# Patient Record
Sex: Female | Born: 1970 | Race: Black or African American | Hispanic: No | Marital: Single | Smoking: Never smoker
Health system: Southern US, Community
[De-identification: ages and names within clinical notes are randomized; demographics above are authoritative.]

---

## 2015-03-01 ENCOUNTER — Emergency Department (HOSPITAL_COMMUNITY)
Admission: EM | Admit: 2015-03-01 | Discharge: 2015-03-01 | Disposition: A | Discharge status: Home / Self Care | Payer: BLUE CROSS/BLUE SHIELD | Attending: Emergency Medicine | Admitting: Emergency Medicine

## 2015-03-01 ENCOUNTER — Emergency Department (HOSPITAL_COMMUNITY): Payer: BLUE CROSS/BLUE SHIELD

## 2015-03-01 ENCOUNTER — Encounter (HOSPITAL_COMMUNITY): Payer: Self-pay | Admitting: Emergency Medicine

## 2015-03-01 DIAGNOSIS — R112 Nausea with vomiting, unspecified: Secondary | ICD-10-CM

## 2015-03-01 DIAGNOSIS — D649 Anemia, unspecified: Secondary | ICD-10-CM

## 2015-03-01 DIAGNOSIS — Z791 Long term (current) use of non-steroidal anti-inflammatories (NSAID): Secondary | ICD-10-CM | POA: Insufficient documentation

## 2015-03-01 DIAGNOSIS — Z79899 Other long term (current) drug therapy: Secondary | ICD-10-CM | POA: Diagnosis not present

## 2015-03-01 DIAGNOSIS — R9431 Abnormal electrocardiogram [ECG] [EKG]: Secondary | ICD-10-CM | POA: Insufficient documentation

## 2015-03-01 DIAGNOSIS — R0602 Shortness of breath: Secondary | ICD-10-CM | POA: Diagnosis present

## 2015-03-01 LAB — CBC
HCT: 30.6 % — ABNORMAL LOW (ref 36.0–46.0)
Hemoglobin: 9.8 g/dL — ABNORMAL LOW (ref 12.0–15.0)
MCH: 24.9 pg — AB (ref 26.0–34.0)
MCHC: 32 g/dL (ref 30.0–36.0)
MCV: 77.9 fL — AB (ref 78.0–100.0)
PLATELETS: 368 10*3/uL (ref 150–400)
RBC: 3.93 MIL/uL (ref 3.87–5.11)
RDW: 14.7 % (ref 11.5–15.5)
WBC: 9.6 10*3/uL (ref 4.0–10.5)

## 2015-03-01 LAB — COMPREHENSIVE METABOLIC PANEL
ALK PHOS: 57 U/L (ref 38–126)
ALT: 22 U/L (ref 14–54)
ANION GAP: 8 (ref 5–15)
AST: 31 U/L (ref 15–41)
Albumin: 3.8 g/dL (ref 3.5–5.0)
BILIRUBIN TOTAL: 0.7 mg/dL (ref 0.3–1.2)
BUN: 11 mg/dL (ref 6–20)
CALCIUM: 9.5 mg/dL (ref 8.9–10.3)
CO2: 25 mmol/L (ref 22–32)
Chloride: 104 mmol/L (ref 101–111)
Creatinine, Ser: 0.46 mg/dL (ref 0.44–1.00)
Glucose, Bld: 113 mg/dL — ABNORMAL HIGH (ref 65–99)
Potassium: 3.7 mmol/L (ref 3.5–5.1)
SODIUM: 137 mmol/L (ref 135–145)
TOTAL PROTEIN: 7 g/dL (ref 6.5–8.1)

## 2015-03-01 LAB — I-STAT TROPONIN, ED: Troponin i, poc: 0 ng/mL (ref 0.00–0.08)

## 2015-03-01 NOTE — Discharge Instructions (Signed)
Anemia, Nonspecific Anemia is a condition in which the concentration of red blood cells or hemoglobin in the blood is below normal. Hemoglobin is a substance in red blood cells that carries oxygen to the tissues of the body. Anemia results in not enough oxygen reaching these tissues.  CAUSES  Common causes of anemia include:   Excessive bleeding. Bleeding may be internal or external. This includes excessive bleeding from periods (in women) or from the intestine.   Poor nutrition.   Chronic kidney, thyroid, and liver disease.  Bone marrow disorders that decrease red blood cell production.  Cancer and treatments for cancer.  HIV, AIDS, and their treatments.  Spleen problems that increase red blood cell destruction.  Blood disorders.  Excess destruction of red blood cells due to infection, medicines, and autoimmune disorders. SIGNS AND SYMPTOMS   Minor weakness.   Dizziness.   Headache.  Palpitations.   Shortness of breath, especially with exercise.   Paleness.  Cold sensitivity.  Indigestion.  Nausea.  Difficulty sleeping.  Difficulty concentrating. Symptoms may occur suddenly or they may develop slowly.  DIAGNOSIS  Additional blood tests are often needed. These help your health care provider determine the best treatment. Your health care provider will check your stool for blood and look for other causes of blood loss.  TREATMENT  Treatment varies depending on the cause of the anemia. Treatment can include:   Supplements of iron, vitamin B12, or folic acid.   Hormone medicines.   A blood transfusion. This may be needed if blood loss is severe.   Hospitalization. This may be needed if there is significant continual blood loss.   Dietary changes.  Spleen removal. HOME CARE INSTRUCTIONS Keep all follow-up appointments. It often takes many weeks to correct anemia, and having your health care provider check on your condition and your response to  treatment is very important. SEEK IMMEDIATE MEDICAL CARE IF:   You develop extreme weakness, shortness of breath, or chest pain.   You become dizzy or have trouble concentrating.  You develop heavy vaginal bleeding.   You develop a rash.   You have bloody or black, tarry stools.   You faint.   You vomit up blood.   You vomit repeatedly.   You have abdominal pain.  You have a fever or persistent symptoms for more than 2-3 days.   You have a fever and your symptoms suddenly get worse.   You are dehydrated.  MAKE SURE YOU:  Understand these instructions.  Will watch your condition.  Will get help right away if you are not doing well or get worse. Document Released: 06/26/2004 Document Revised: 01/19/2013 Document Reviewed: 11/12/2012 [facility] Patient Information 2015 [place], [place]. This information is not intended to replace advice given to you by your health care provider. Make sure you discuss any questions you have with your health care provider.  Nausea and Vomiting Nausea is a sick feeling that often comes before throwing up (vomiting). Vomiting is a reflex where stomach contents come out of your mouth. Vomiting can cause severe loss of body fluids (dehydration). Children and elderly adults can become dehydrated quickly, especially if they also have diarrhea. Nausea and vomiting are symptoms of a condition or disease. It is important to find the cause of your symptoms. CAUSES   Direct irritation of the stomach lining. This irritation can result from increased acid production (gastroesophageal reflux disease), infection, food poisoning, taking certain medicines (such as nonsteroidal anti-inflammatory drugs), alcohol use, or tobacco use.  Signals  from the brain.These signals could be caused by a headache, heat exposure, an inner ear disturbance, increased pressure in the brain from injury, infection, a tumor, or a concussion, pain, emotional stimulus, or  metabolic problems.  An obstruction in the gastrointestinal tract (bowel obstruction).  Illnesses such as diabetes, hepatitis, gallbladder problems, appendicitis, kidney problems, cancer, sepsis, atypical symptoms of a heart attack, or eating disorders.  Medical treatments such as chemotherapy and radiation.  Receiving medicine that makes you sleep (general anesthetic) during surgery. DIAGNOSIS Your caregiver may ask for tests to be done if the problems do not improve after a few days. Tests may also be done if symptoms are severe or if the reason for the nausea and vomiting is not clear. Tests may include:  Urine tests.  Blood tests.  Stool tests.  Cultures (to look for evidence of infection).  X-rays or other imaging studies. Test results can help your caregiver make decisions about treatment or the need for additional tests. TREATMENT You need to stay well hydrated. Drink frequently but in small amounts.You may wish to drink water, sports drinks, clear broth, or eat frozen ice pops or gelatin dessert to help stay hydrated.When you eat, eating slowly may help prevent nausea.There are also some antinausea medicines that may help prevent nausea. HOME CARE INSTRUCTIONS   Take all medicine as directed by your caregiver.  If you do not have an appetite, do not force yourself to eat. However, you must continue to drink fluids.  If you have an appetite, eat a normal diet unless your caregiver tells you differently.  Eat a variety of complex carbohydrates (rice, wheat, potatoes, bread), lean meats, yogurt, fruits, and vegetables.  Avoid high-fat foods because they are more difficult to digest.  Drink enough water and fluids to keep your urine clear or pale yellow.  If you are dehydrated, ask your caregiver for specific rehydration instructions. Signs of dehydration may include:  Severe thirst.  Dry lips and mouth.  Dizziness.  Dark urine.  Decreasing urine frequency and  amount.  Confusion.  Rapid breathing or pulse. SEEK IMMEDIATE MEDICAL CARE IF:   You have blood or brown flecks (like coffee grounds) in your vomit.  You have black or bloody stools.  You have a severe headache or stiff neck.  You are confused.  You have severe abdominal pain.  You have chest pain or trouble breathing.  You do not urinate at least once every 8 hours.  You develop cold or clammy skin.  You continue to vomit for longer than 24 to 48 hours.  You have a fever. MAKE SURE YOU:   Understand these instructions.  Will watch your condition.  Will get help right away if you are not doing well or get worse. Document Released: 05/19/2005 Document Revised: 08/11/2011 Document Reviewed: 10/16/2010 [facility] Patient Information 2015 [place], [place]. This information is not intended to replace advice given to you by your health care provider. Make sure you discuss any questions you have with your health care provider.  Shortness of Breath Shortness of breath means you have trouble breathing. It could also mean that you have a medical problem. You should get immediate medical care for shortness of breath. CAUSES   Not enough oxygen in the air such as with high altitudes or a smoke-filled room.  Certain lung diseases, infections, or problems.  Heart disease or conditions, such as angina or heart failure.  Low red blood cells (anemia).  Poor physical fitness, which can cause shortness of  breath when you exercise.  Chest or back injuries or stiffness.  Being overweight.  Smoking.  Anxiety, which can make you feel like you are not getting enough air. DIAGNOSIS  Serious medical problems can often be found during your physical exam. Tests may also be done to determine why you are having shortness of breath. Tests may include:  Chest X-rays.  Lung function tests.  Blood tests.  An electrocardiogram (ECG).  An ambulatory electrocardiogram. An ambulatory ECG  records your heartbeat patterns over a 24-hour period.  Exercise testing.  A transthoracic echocardiogram (TTE). During echocardiography, sound waves are used to evaluate how blood flows through your heart.  A transesophageal echocardiogram (TEE).  Imaging scans. Your health care provider may not be able to find a cause for your shortness of breath after your exam. In this case, it is important to have a follow-up exam with your health care provider as directed.  TREATMENT  Treatment for shortness of breath depends on the cause of your symptoms and can vary greatly. HOME CARE INSTRUCTIONS   Do not smoke. Smoking is a common cause of shortness of breath. If you smoke, ask for help to quit.  Avoid being around chemicals or things that may bother your breathing, such as paint fumes and dust.  Rest as needed. Slowly resume your usual activities.  If medicines were prescribed, take them as directed for the full length of time directed. This includes oxygen and any inhaled medicines.  Keep all follow-up appointments as directed by your health care provider. SEEK MEDICAL CARE IF:   Your condition does not improve in the time expected.  You have a hard time doing your normal activities even with rest.  You have any new symptoms. SEEK IMMEDIATE MEDICAL CARE IF:   Your shortness of breath gets worse.  You feel light-headed, faint, or develop a cough not controlled with medicines.  You start coughing up blood.  You have pain with breathing.  You have chest pain or pain in your arms, shoulders, or abdomen.  You have a fever.  You are unable to walk up stairs or exercise the way you normally do. MAKE SURE YOU:  Understand these instructions.  Will watch your condition.  Will get help right away if you are not doing well or get worse. Document Released: 02/11/2001 Document Revised: 05/24/2013 Document Reviewed: 08/04/2011 [facility] Patient Information 2015 [place], [place]. This  information is not intended to replace advice given to you by your health care provider. Make sure you discuss any questions you have with your health care provider.

## 2015-03-01 NOTE — ED Provider Notes (Signed)
CSN: [id number]     Arrival date & time 03/01/15  1422 History   First MD Initiated Contact with Patient 03/01/15 1423     Chief Complaint  Patient presents with  . Shortness of Breath  . Abnormal ECG     (Consider location/radiation/quality/duration/timing/severity/associated sxs/prior Treatment) HPI Comments: 44 year old female presenting via EMS from work with sudden onset shortness of breath. 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 After vomiting, patient started to feel fine. She went to see the nurse on-site, had an EKG which showed ST depression in V1 and V3 and was sent to the ED. Denies any cardiac history. At no point did she have chest pain. Currently denies chest pain, shortness of breath, nausea, vomiting, numbness, tingling or extremity weakness. Has a slight headache. She vomited what she ate. Denies abdominal pain.   Patient is a 44 y.o. female presenting with shortness of breath. The history is provided by the patient and the EMS personnel. The history is limited by a language barrier. A language interpreter was used.  Shortness of Breath Onset quality:  Sudden Duration:  2 hours Timing:  Rare Progression:  Resolved Chronicity:  New Relieved by: vomiting. Worsened by:  Nothing tried Ineffective treatments:  None tried Associated symptoms: vomiting     History reviewed. No pertinent past medical history. History reviewed. No pertinent past surgical history. No family history on file. Social History  Substance Use Topics  . Smoking status: Never Smoker   . Smokeless tobacco: None  . Alcohol Use: No   OB History    No data available     Review of Systems  Respiratory: Positive for shortness of breath.   Gastrointestinal: Positive for vomiting.  All other systems reviewed and are  negative.     Allergies  Review of patient's allergies indicates no known allergies.  Home Medications   Prior to Admission medications   Medication Sig Start Date End Date Taking? Authorizing Provider  cyclobenzaprine (FLEXERIL) 5 MG tablet Take 5 mg by mouth 3 (three) times daily as needed for muscle spasms.   Yes Historical Provider, MD  diclofenac (VOLTAREN) 75 MG EC tablet Take 75 mg by mouth 2 (two) times daily.   Yes Historical Provider, MD  metFORMIN (GLUCOPHAGE) 500 MG tablet Take 500 mg by mouth 2 (two) times daily with a meal.   Yes Historical Provider, MD   BP 115/73 mmHg  Pulse 73  Temp(Src) 97.6 F (36.4 C) (Oral)  Resp 10  SpO2 100% Physical Exam  Constitutional: She is oriented to person, place, and time. She appears well-developed and well-nourished. No distress.  HENT:  Head: Normocephalic and atraumatic.  Mouth/Throat: Oropharynx is clear and moist.  Eyes: Conjunctivae and EOM are normal. Pupils are equal, round, and reactive to light.  Neck: Normal range of motion. Neck supple. No JVD present.  Cardiovascular: Normal rate, regular rhythm, normal heart sounds and intact distal pulses.   No extremity edema.  Pulmonary/Chest: Effort normal and breath sounds normal. No respiratory distress. She exhibits no tenderness.  Abdominal: Soft. Bowel sounds are normal. There is no tenderness.  Musculoskeletal: Normal range of motion. She exhibits no edema.  Neurological: She is alert and oriented to person, place, and time. She has normal strength. No sensory deficit.  Speech fluent, goal oriented. Moves extremities without ataxia. Equal grip strength  bilateral.  Skin: Skin is warm and dry. She is not diaphoretic.  Psychiatric: She has a normal mood and affect. Her behavior is normal.  Nursing note and vitals reviewed.   ED Course  Procedures (including critical care time) Labs Review Labs Reviewed  CBC - Abnormal; Notable for the following:    Hemoglobin 9.8  (*)    HCT 30.6 (*)    MCV 77.9 (*)    MCH 24.9 (*)    All other components within normal limits  COMPREHENSIVE METABOLIC PANEL - Abnormal; Notable for the following:    Glucose, Bld 113 (*)    All other components within normal limits  I-STAT TROPOININ, ED    Imaging Review Dg Chest 2 View  03/01/2015   CLINICAL DATA:  Short of breath  EXAM: CHEST  2 VIEW  COMPARISON:  None.  FINDINGS: The heart size and mediastinal contours are within normal limits. Both lungs are clear. The visualized skeletal structures are unremarkable.  IMPRESSION: No active cardiopulmonary disease.   Electronically Signed   By: Marlan Palau M.D.   On: 03/01/2015 14:59   I have personally reviewed and evaluated these images and lab results as part of my medical decision-making.   EKG Interpretation   Date/Time:  Thursday March 01 2015 14:35:14 EDT Ventricular Rate:  71 PR Interval:  164 QRS Duration: 97 QT Interval:  418 QTC Calculation: 454 R Axis:   42 Text Interpretation:  Sinus rhythm Borderline T abnormalities, anterior  leads Confirmed by Lincoln Brigham [phone]) on 03/01/2015 3:17:59 PM      MDM   Final diagnoses:  Shortness of breath  Non-intractable vomiting with nausea, vomiting of unspecified type  Anemia, unspecified anemia type   Nontoxic appearing, NAD. AF VSS. 2 hours of shortness of breath followed by vomiting. No associated chest pain or any other symptoms. Workup significant for hemoglobin of 9.8. Patient is on her menstrual cycle towards the end and states they are generally very heavy. Workup otherwise negative. Doubt cardiac. Heart score 2. PERC negative. No acute finding or concern warranting admission at this time. Stable for d/c with PCP f/u. Return precautions given. Patient and parent state understanding of treatment care plan and are agreeable.   Kathrynn Speed, PA-C 03/01/15 1631  Tilden Fossa, MD 03/01/15 [phone]

## 2015-03-01 NOTE — ED Notes (Signed)
Per EMS: pt from work for eval of sudden onset of sob and emesis, pt states she vomited twice. Denies any numbness with EMS but reports Headache. Pt went to see RN on site and EKG showed ST depression in v1 and v3, pt denies any cp or sob at this time. Pt alert and oriented, skin warm and dry. nad noted. Interpreter used for triage and assessment.

## 2015-03-01 NOTE — ED Notes (Signed)
Patient transported to X-ray 

## 2016-08-28 IMAGING — DX DG CHEST 2V
2 series · 2 of 2 positions shown · non-contrast
Comparison: None.

CLINICAL DATA: Short of breath

EXAM:
CHEST  2 VIEW

[chest pa]
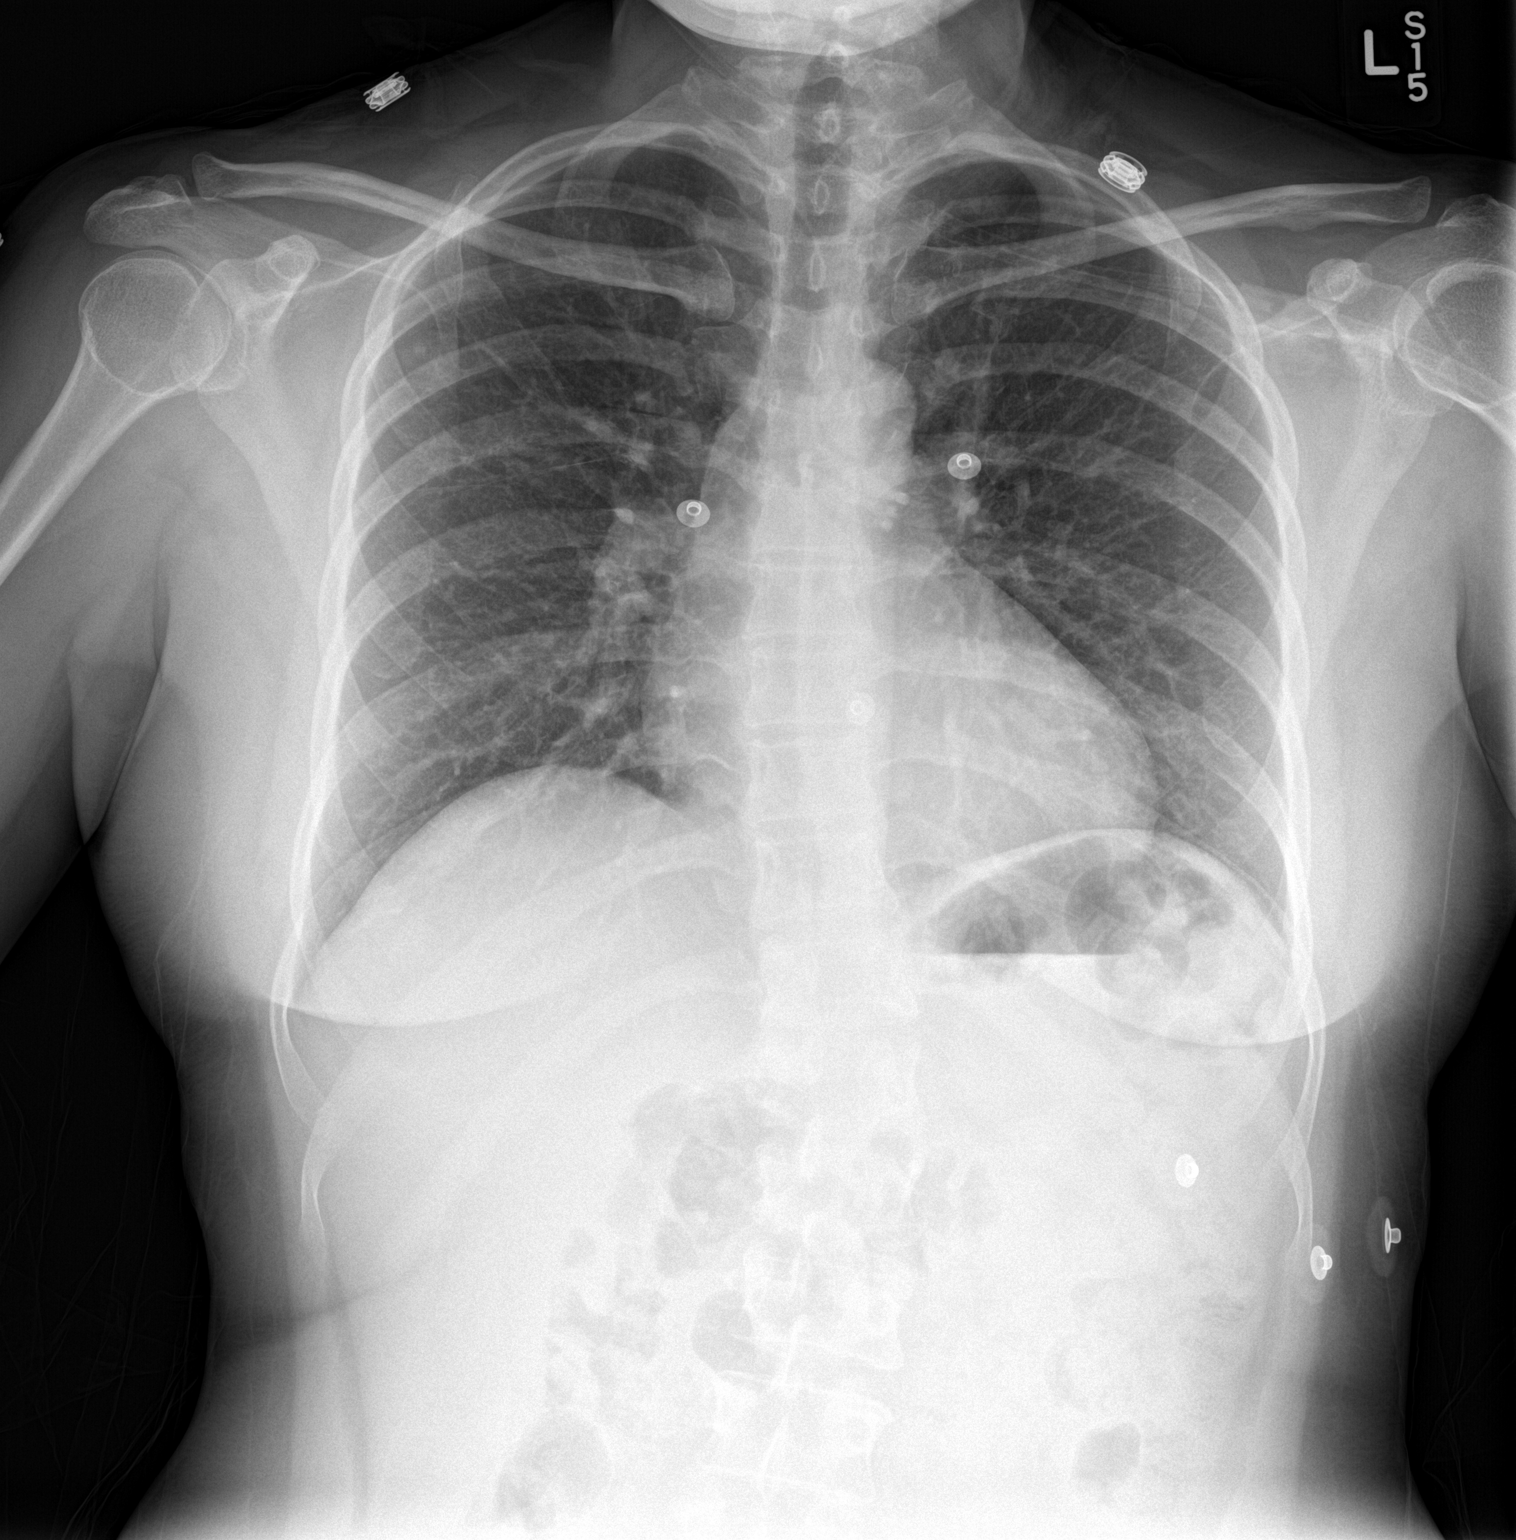

[chest lat]
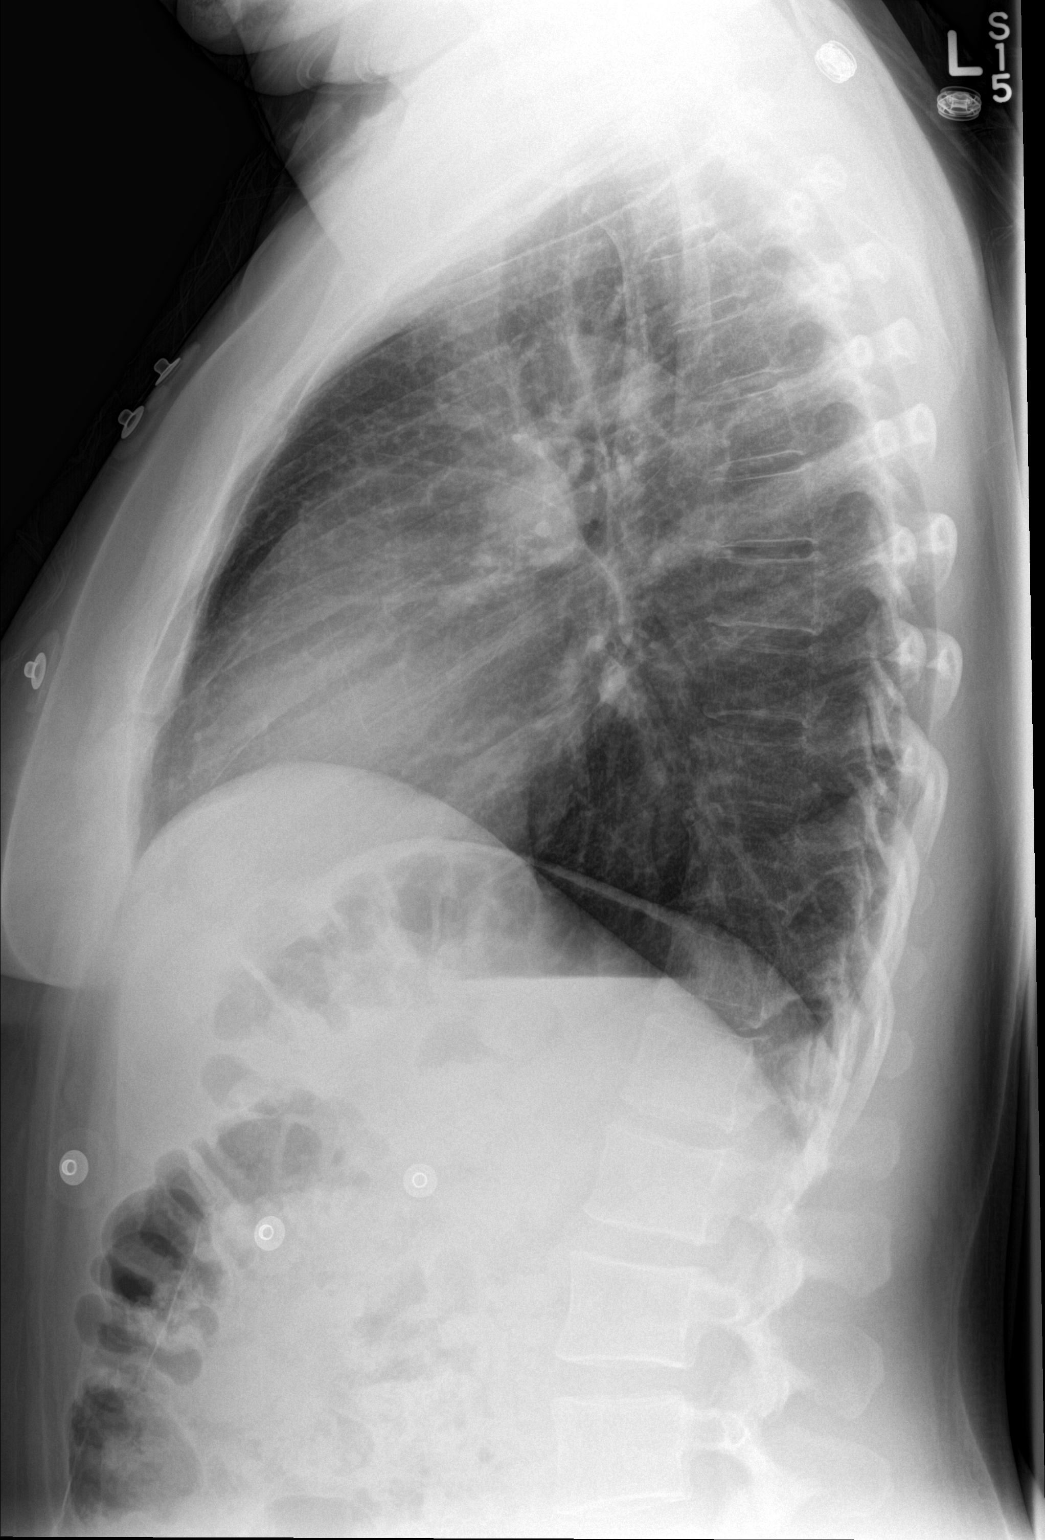

[2 of 2 positions shown; findings below may reference images not displayed]

FINDINGS: The heart size and mediastinal contours are within normal limits.
Both lungs are clear. The visualized skeletal structures are
unremarkable.
IMPRESSION: No active cardiopulmonary disease.

## 2022-07-09 ENCOUNTER — Other Ambulatory Visit: Payer: BLUE CROSS/BLUE SHIELD
